# Patient Record
Sex: Male | Born: 1995 | Race: White | Hispanic: No | Marital: Single | Smoking: Never smoker
Health system: Southern US, Community
[De-identification: ages and names within clinical notes are randomized; demographics above are authoritative.]

## PROBLEM LIST (undated history)

## (undated) DIAGNOSIS — L709 Acne, unspecified: Secondary | ICD-10-CM

## (undated) HISTORY — DX: Acne, unspecified: L70.9

---
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
## 2016-05-16 ENCOUNTER — Ambulatory Visit: Payer: 59 | Admitting: Podiatry

## 2016-05-19 ENCOUNTER — Ambulatory Visit (INDEPENDENT_AMBULATORY_CARE_PROVIDER_SITE_OTHER): Payer: 59

## 2016-05-19 ENCOUNTER — Ambulatory Visit (INDEPENDENT_AMBULATORY_CARE_PROVIDER_SITE_OTHER): Payer: 59 | Admitting: Podiatry

## 2016-05-19 ENCOUNTER — Encounter: Payer: Self-pay | Admitting: Podiatry

## 2016-05-19 VITALS — BP 122/71 | HR 108 | Resp 18

## 2016-05-19 DIAGNOSIS — M2141 Flat foot [pes planus] (acquired), right foot: Secondary | ICD-10-CM

## 2016-05-19 DIAGNOSIS — M779 Enthesopathy, unspecified: Secondary | ICD-10-CM

## 2016-05-19 DIAGNOSIS — M2142 Flat foot [pes planus] (acquired), left foot: Secondary | ICD-10-CM | POA: Diagnosis not present

## 2016-05-19 DIAGNOSIS — Q6689 Other  specified congenital deformities of feet: Secondary | ICD-10-CM

## 2016-05-19 DIAGNOSIS — M79672 Pain in left foot: Secondary | ICD-10-CM

## 2016-05-19 NOTE — Progress Notes (Signed)
   Subjective:    Patient ID: Shawn Osborne, male    DOB: January 01, 1996, 20 y.o.   MRN: [id number]  HPI  20 year old male presents the office if his mom for concerns of left foot pain which is been ongoing for approximately 2 months. He gets majority of pain to the outside aspect of his left foot mostly to a lot of walking or standing or after working all week. He did go see Dr. Eulah Pont had a steroid injection performed which should help for about 1 week. He said no other treatment. Denies any recent injury or trauma. No swelling or redness at this time. The pain does not wake him up at night. No other complaints.  Review of Systems  All other systems reviewed and are negative.      Objective:   Physical Exam General: AAO x3, NAD  Dermatological: Skin is warm, dry and supple bilateral. Nails x 10 are well manicured; remaining integument appears unremarkable at this time. There are no open sores, no preulcerative lesions, no rash or signs of infection present.  Vascular: Dorsalis Pedis artery and Posterior Tibial artery pedal pulses are 2/4 bilateral with immedate capillary fill time. Pedal hair growth present. There is no pain with calf compression, swelling, warmth, erythema.   Neruologic: Grossly intact via light touch bilateral. Vibratory intact via tuning fork bilateral. Protective threshold with Semmes Wienstein monofilament intact to all pedal sites bilateral.   Musculoskeletal: There is a decrease in medial arch upon weightbearing bilaterally. Her left foot there does appear to be decreased range of motion of the subtalar joint and the foot is sitting and somewhat everted position and a peroneal spasm type position. There is Taylor's on the lateral aspect of the sinus tarsi on the left foot. Unable to re-create the arch and the left side with dorsiflexion of the hallux. There is no other areas of tenderness bilaterally. Mild equinus is present. MMT 5/5.  Gait: Unassisted, Nonantalgic.       Assessment & Plan:  20 year old male left foot pain, flatfoot, rule out tarsal coalition -Treatment options discussed including all alternatives, risks, and complications -Etiology of symptoms were discussed -X-rays were obtained and reviewed with the patient. Flatfoot deformity is present. No evidence of acute fracture or stress fracture. Unable to visualize osseous coalition. -At this time given his symptoms as well as pain and recommended MRI to rule out tarsal coalition. This was ordered today. -Discussed with him surgical intervention versus orthotics. We'll await results the MRI before proceeding with further treatment. -Follow-up after MRI or sooner if any problems arise. In the meantime, encouraged to call the office with any questions, concerns, change in symptoms.   Ovid Curd, DPM

## 2016-05-20 ENCOUNTER — Telehealth: Payer: Self-pay | Admitting: *Deleted

## 2016-05-20 NOTE — Telephone Encounter (Addendum)
Dr. Ardelle Anton ordere MRI left foot.  [employer] requirres PEER TO PEER [phone] for HXTA#[id number]. AUTHORIZATION [phone], VALID Tanna Savoy 07/06/2016. FAXED TO [facility]. 05/28/2016-Left message informing pt's mtr that Dr. Ardelle Anton was sending MRI copy disc to [facility] overread service for more indepth reading and there would be a delay of the MRI results. Mailed to [employer]. Pt's mtr, Silva Bandy called with questions about the overread.  I left message informing pt's mtr that the overread was performed by a specializing radiologist, and Dr. Ardelle Anton may have wanted to see the areas affected more clearly or the severity of the structure formation. 06/02/2016-Left message requesting call back for overread results.

## 2016-05-22 NOTE — Telephone Encounter (Signed)
Authorization [id number] until 07/06/2016

## 2016-05-27 ENCOUNTER — Ambulatory Visit
Admission: RE | Admit: 2016-05-27 | Discharge: 2016-05-27 | Disposition: A | Discharge status: Home / Self Care | Payer: 59 | Source: Ambulatory Visit | Attending: Podiatry | Admitting: Podiatry

## 2016-05-27 ENCOUNTER — Encounter: Payer: Self-pay | Admitting: Podiatry

## 2016-05-27 DIAGNOSIS — M79672 Pain in left foot: Secondary | ICD-10-CM

## 2016-05-27 DIAGNOSIS — Q6689 Other  specified congenital deformities of feet: Secondary | ICD-10-CM

## 2016-05-28 NOTE — Telephone Encounter (Signed)
-----   Message from Vivi BarrackMatthew R Wagoner, DPM sent at 05/28/2016 11:52 AM EDT ----- Can you send the MRI for a second opinion and please let the patient know. Thanks.

## 2016-06-04 ENCOUNTER — Ambulatory Visit (INDEPENDENT_AMBULATORY_CARE_PROVIDER_SITE_OTHER): Payer: 59 | Admitting: Podiatry

## 2016-06-04 ENCOUNTER — Encounter: Payer: Self-pay | Admitting: Podiatry

## 2016-06-04 DIAGNOSIS — M2141 Flat foot [pes planus] (acquired), right foot: Secondary | ICD-10-CM

## 2016-06-04 DIAGNOSIS — Q6689 Other  specified congenital deformities of feet: Secondary | ICD-10-CM | POA: Diagnosis not present

## 2016-06-04 DIAGNOSIS — M2142 Flat foot [pes planus] (acquired), left foot: Secondary | ICD-10-CM

## 2016-06-06 DIAGNOSIS — Q6689 Other  specified congenital deformities of feet: Secondary | ICD-10-CM | POA: Insufficient documentation

## 2016-06-06 NOTE — Progress Notes (Signed)
Subjective: 20 year old male presents the office today for follow-up evaluation of left foot pain and to discuss MRI results. Since last appointment he is no significant change. Scheduled at the college on Friday. Denies any systemic complaints such as fevers, chills, nausea, vomiting. No acute changes since last appointment, and no other complaints at this time.   Objective: AAO x3, NAD DP/PT pulses palpable bilaterally, CRT less than 3 seconds There is decreased range of motion the subtotal the left side there is tennis on lateral aspect of the foot on the sinus tarsi. There is a decrease in medial arch height which appears to be rigid. Equinus is present. There is no other areas of tenderness identified at this time. No open lesions or pre-ulcerative lesions No pain with calf compression, swelling, warmth, erythema.   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 I discussed with him treatment options including both surgical and nonsurgical treatment. He is scheduled to college on Friday. He wishes to hold off on surgery at this point if possible. I discussed with them the etiology of this coalition as well as the progressive nature. At this time a discussed with him custom orthotics with a deep heel cup in the meantime. He was scanned for [facility] today [facility]  -Patient encouraged to call the office with any questions, concerns, change in symptoms.   Ovid CurdMatthew Wagoner, DPM

## 2017-03-05 ENCOUNTER — Ambulatory Visit (INDEPENDENT_AMBULATORY_CARE_PROVIDER_SITE_OTHER): Payer: 59 | Admitting: Podiatry

## 2017-03-05 DIAGNOSIS — Q6689 Other  specified congenital deformities of feet: Secondary | ICD-10-CM | POA: Diagnosis not present

## 2017-03-05 DIAGNOSIS — M79672 Pain in left foot: Secondary | ICD-10-CM

## 2017-03-05 NOTE — Patient Instructions (Signed)

## 2017-03-09 NOTE — Progress Notes (Signed)
Subjective: 21 year old male presents the office today for follow-up evaluation of left foot pain. He states he has been wearing the orthotics which does help some he continues to get quite a bit of pain in the outside aspect of the left foot is becoming an ongoing issue. He states it is home for the summer and he will do him proceed with surgical intervention as the pain is not significantly improved with the orthotics. He states this hurts on a daily basis and also tried changing shoes that he significant improvement. Denies any systemic complaints such as fevers, chills, nausea, vomiting. No acute changes since last appointment, and no other complaints at this time.   Objective: AAO x3, NAD DP/PT pulses palpable bilaterally, CRT less than 3 seconds There is decreased range of motion the subtotal the left side there is tenderness on lateral aspect of the foot on the sinus tarsi. There is a decrease in medial arch height which appears to be rigid. Equinus is present. There is no other areas of tenderness identified at this time. No open lesions or pre-ulcerative lesions No pain with calf compression, swelling, warmth, erythema.   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 At this point he wishes to proceed with surgical intervention. I discussed with him coalition resection and he wishes to proceed with this. He presents with his mother. I discussed with him the surgery as well as the postoperative course including risks and complications. Discussed with remaining to have a subtalar joint fusion in the future or he could have worsening pain after the surgery. -The incision placement as well as the postoperative course was discussed with the patient. I discussed risks of the surgery which include,  but not limited to, infection, bleeding, pain, swelling, need for further surgery, delayed or nonhealing, painful or ugly scar, numbness or sensation changes, over/under correction, recurrence, transfer lesions, further deformity, hardware failure, DVT/PE, loss of toe/foot. Patient understands these risks and wishes to proceed with surgery. The surgical consent was reviewed with the patient all 3 pages were signed. No promises or guarantees were given to the outcome of the procedure. All questions were answered to the best of my ability. Before the surgery the patient was encouraged to call the office if there is any further questions. [facility]. -CAM boot dispensed for postop and directed on use.   Ovid CurdMatthew Lacey Dotson, DPM

## 2017-03-23 ENCOUNTER — Telehealth: Payer: Self-pay | Admitting: *Deleted

## 2017-03-23 NOTE — Telephone Encounter (Signed)
"  My son, Fayrene FearingJames, is having surgery with Dr. Ardelle AntonWagoner on Wednesday.  I want to find out if the insurance company has approved.  I have received no information from you guys on what our cost will be or If it's even been approved.  Please give me a call.  Chip BoerVicki returned her call and gave her an estimate.  She also informed her that the surgery had been approved by [facility].

## 2017-03-25 ENCOUNTER — Encounter: Payer: Self-pay | Admitting: Podiatry

## 2017-03-25 DIAGNOSIS — Q6689 Other  specified congenital deformities of feet: Secondary | ICD-10-CM | POA: Diagnosis not present

## 2017-03-30 ENCOUNTER — Other Ambulatory Visit: Payer: Self-pay | Admitting: Podiatry

## 2017-03-30 ENCOUNTER — Ambulatory Visit (INDEPENDENT_AMBULATORY_CARE_PROVIDER_SITE_OTHER): Payer: 59

## 2017-03-30 ENCOUNTER — Ambulatory Visit (INDEPENDENT_AMBULATORY_CARE_PROVIDER_SITE_OTHER): Payer: Self-pay | Admitting: Podiatry

## 2017-03-30 ENCOUNTER — Encounter: Payer: Self-pay | Admitting: Podiatry

## 2017-03-30 DIAGNOSIS — Q6689 Other  specified congenital deformities of feet: Secondary | ICD-10-CM

## 2017-03-30 NOTE — Progress Notes (Signed)
Subjective: Shawn Osborne is a 21 y.o. is seen today in office s/p left subtalar joint coalition resection preformed on 03/25/2017. They state their pain is improved. He states pain medication mostly at night or if he goes out to dinner or something and his feet are down and they well. He has been NWB and wearing the surgical boot. Denies any systemic complaints such as fevers, chills, nausea, vomiting. No calf pain, chest pain, shortness of breath.   Objective: General: No acute distress, AAOx3  DP/PT pulses palpable 2/4, CRT < 3 sec to all digits.  Protective sensation intact. Motor function intact.  Left foot: Incision is well coapted without any evidence of dehiscence and sutures and staples are intact. There is no surrounding erythema, ascending cellulitis, fluctuance, crepitus, malodor, drainage/purulence. There is mild edema around the surgical site. There is mild pain along the surgical site. There is some pain with STJ ROM, at the end of ROM but the motion is improved compared to preoperatively. The patients mother also noted increased motion.  No other areas of tenderness to bilateral lower extremities.  No other open lesions or pre-ulcerative lesions.  No pain with calf compression, swelling, warmth, erythema.   Assessment and Plan:  Status post right STJ coalition resection, doing well with no complications   -Treatment options discussed including all alternatives, risks, and complications -X-rays were obtained and reviewed with the patient. No evidence of acute fracture.  -Antibiotic ointment and a bandage applied. Keep clean, dry, intact.  -Ice/elevation -NWB- can start to WB in the CAM boot this weekend if able. Also gentle ROM exercises.  -Pain medication as needed. -Monitor for any clinical signs or symptoms of infection and DVT/PE and directed to call the office immediately should any occur or go to the ER. -Follow-up in 1 week for suture rmeoval or sooner if any problems  arise. In the meantime, encouraged to call the office with any questions, concerns, change in symptoms.   Ovid CurdMatthew Santonio Speakman, DPM  Ovid CurdMatthew Berta Denson, DPM

## 2017-04-06 ENCOUNTER — Ambulatory Visit (INDEPENDENT_AMBULATORY_CARE_PROVIDER_SITE_OTHER): Payer: Self-pay | Admitting: Podiatry

## 2017-04-06 ENCOUNTER — Encounter: Payer: Self-pay | Admitting: Podiatry

## 2017-04-06 DIAGNOSIS — Q6689 Other  specified congenital deformities of feet: Secondary | ICD-10-CM

## 2017-04-06 NOTE — Progress Notes (Signed)
Subjective: Shawn Osborne is a 21 y.o. is seen today in office s/p left subtalar joint coalition resection preformed on 03/25/2017. He states he is doing well and is built to walk in the cam boot without any pain. He has been taking IV present for payment not taking any narcotic pain medicine. He has done some gradual range of motion exercises but not much.  Denies any systemic complaints such as fevers, chills, nausea, vomiting. No calf pain, chest pain, shortness of breath.   Objective: General: No acute distress, AAOx3  DP/PT pulses palpable 2/4, CRT < 3 sec to all digits.  Protective sensation intact. Motor function intact.  Left foot: Incision is well coapted without any evidence of dehiscence and sutures and staples are intact. There is no surrounding erythema, ascending cellulitis, fluctuance, crepitus, malodor, drainage/purulence. There is mild edema around the surgical site, however improved. There is minimal pain along the surgical site. Subtalar joint range of motion is improved compared to preoperatively however still restricted. There is no pain with range of motion today. No other areas of tenderness to bilateral lower extremities.  No other open lesions or pre-ulcerative lesions.  No pain with calf compression, swelling, warmth, erythema.   Assessment and Plan:  Status post right STJ coalition resection, doing well with no complications   -Treatment options discussed including all alternatives, risks, and complications -At this time apply for him to continue with the  CAM boot beginning weightbearing as tolerated. Discussed the range of motion exercises. Because a solid the stitches the staples intact until next appointment. Continue ice and elevation and anti-inflammatories as needed. -Monitor for any clinical signs or symptoms of infection and DVT/PE and directed to call the office immediately should any occur or go to the ER. -Follow-up in 1 week for suture removal or sooner if  any problems arise. In the meantime, encouraged to call the office with any questions, concerns, change in symptoms.   Ovid CurdMatthew Wagoner, DPM

## 2017-04-10 NOTE — Progress Notes (Signed)
DOS 06.06.2018 Left foot subtalar join coalition removal.

## 2017-04-13 ENCOUNTER — Ambulatory Visit (INDEPENDENT_AMBULATORY_CARE_PROVIDER_SITE_OTHER): Payer: 59 | Admitting: Podiatry

## 2017-04-13 DIAGNOSIS — Q6689 Other  specified congenital deformities of feet: Secondary | ICD-10-CM

## 2017-04-15 NOTE — Progress Notes (Signed)
Subjective: Shawn SimsJames M Osborne is a 21 y.o. is seen today in office s/p left subtalar joint coalition resection preformed on 03/25/2017. He states he is doing well and is built to walk in the cam boot without any pain. He has been taking IV present for payment not taking any narcotic pain medicine. He has done some gradual range of motion exercises but not much.  Denies any systemic complaints such as fevers, chills, nausea, vomiting. No calf pain, chest pain, shortness of breath.   Objective: General: No acute distress, AAOx3  DP/PT pulses palpable 2/4, CRT < 3 sec to all digits.  Protective sensation intact. Motor function intact.  Left foot: Incision is well coapted without any evidence of dehiscence and sutures and staples are intact. There is no surrounding erythema, ascending cellulitis, fluctuance, crepitus, malodor, drainage/purulence. There is minimal edema around the surgical site. There is no pain along the surgical site. Subtalar joint range of motion is improved compared to preoperatively however still restricted. There is only pain at end range of motion of the joint. No other areas of tenderness identified at this time.  There is no pain with range of motion today. No other areas of tenderness to bilateral lower extremities.  No other open lesions or pre-ulcerative lesions.  No pain with calf compression, swelling, warmth, erythema.   Assessment and Plan:  Status post right STJ coalition resection, doing well with no complications   -Treatment options discussed including all alternatives, risks, and complications -Sutures removed the staples remained intact. Antibiotic ointment and a bandage was applied. -Continue with range of motion exercises. Continue walking in CAM boot for the next 2 weeks. At that point we'll start physical therapy and likely start to transition to a shoe. Continue ice and elevation. He is not requiring pain medication this point.I'll see him back in 2 weeks or  sooner if needed. I encouraged to call the questions or concerns or any change in symptoms. -Monitor for any clinical signs or symptoms of infection and DVT/PE and directed to call the office immediately should any occur or go to the ER.  Shawn CurdMatthew Osborne, DPM

## 2017-05-04 ENCOUNTER — Ambulatory Visit (INDEPENDENT_AMBULATORY_CARE_PROVIDER_SITE_OTHER): Payer: Self-pay | Admitting: Podiatry

## 2017-05-04 DIAGNOSIS — Q6689 Other  specified congenital deformities of feet: Secondary | ICD-10-CM

## 2017-05-05 NOTE — Progress Notes (Signed)
Subjective: Shawn Osborne is a 21 y.o. is seen today in office s/p left subtalar joint coalition resection preformed on 03/25/2017. Since last appointment he states that he is doing well and he is returned to a regular shoe for which she presents in today. He is wearing a flat shoe without his orthotic today. He states the pain is improved compared to what it was prior to surgery. His only issue today staples or pulling and causing itching. Denies any drainage or redness or any swelling along the surgical site. Overall he states he is doing well. Denies any systemic complaints such as fevers, chills, nausea, vomiting. No calf pain, chest pain, shortness of breath.   Objective: General: No acute distress, AAOx3  DP/PT pulses palpable 2/4, CRT < 3 sec to all digits.  Protective sensation intact. Motor function intact.  Left foot: Incision is well coapted without any evidence of dehiscence and  staples are intact. There is faint redness from around the staple sites however there is no other areas of redness and there is no ascending cellulitis. There is no drainage or also identified. There is no clinical signs of infection. Minimal tenderness palpation of the surgical sites however this appears to be mostly on the staples. There is no pain on the sinus tarsi today. There is actually somewhat improved range of motion of the subtalar joint compared to what it was even when I last saw him as his swelling has improved. There is no area pinpoint tenderness. No other open lesions or pre-ulcerative lesions.  No pain with calf compression, swelling, warmth, erythema.   Assessment and Plan:  Status post right STJ coalition resection, doing well with no complications   -Treatment options discussed including all alternatives, risks, and complications -Staples were removed today without any complications and afterwards the incision remained coapted. Antibiotic ointment and a bandage was applied. Continue daily  dressing changes. -Continue cocoa butter/vitamin E cream to help with scarring. -Continue range of motion exercises. We'll also start physical therapy and a prescription was given to him today for [facility] physical therapy. -Continue orthotics as well as supportive shoes. -Gradually increase activity level. -Follow-up in 3 weeks or sooner if needed. Call any questions or concerns in the meantime.  Ovid CurdMatthew Wagoner, DPM

## 2017-05-25 ENCOUNTER — Ambulatory Visit: Payer: 59 | Admitting: Podiatry

## 2017-06-04 ENCOUNTER — Ambulatory Visit (INDEPENDENT_AMBULATORY_CARE_PROVIDER_SITE_OTHER): Payer: 59 | Admitting: Podiatry

## 2017-06-04 ENCOUNTER — Ambulatory Visit (INDEPENDENT_AMBULATORY_CARE_PROVIDER_SITE_OTHER): Payer: 59

## 2017-06-04 ENCOUNTER — Encounter: Payer: Self-pay | Admitting: Podiatry

## 2017-06-04 DIAGNOSIS — Q6689 Other  specified congenital deformities of feet: Secondary | ICD-10-CM

## 2017-06-05 NOTE — Progress Notes (Signed)
Subjective: Shawn Osborne is a 21 y.o. is seen today in office s/p left subtalar joint coalition resection preformed on 03/25/2017. He states he is doing very well and he presents today wearing a flip-flop. He states he did not go to physical therapy. She's had no pain has been able to do several activities without any problems. He just did a 2.6 mile hike at [employer] and he had no pain. States his symptoms are much improved compared to what it was prior to surgery and he is happy with the result. He has no concerns. Denies any systemic complaints such as fevers, chills, nausea, vomiting. No calf pain, chest pain, shortness of breath.   Objective: General: No acute distress, AAOx3  DP/PT pulses palpable 2/4, CRT < 3 sec to all digits.  Protective sensation intact. Motor function intact.  Left foot: Incision is well coapted without any evidence of dehiscence and  a scar is well formed. There is no pain the surgical site. There is no swelling erythema, ascending cellulitis, drainage that is no clinical signs of infection the area is well-healed. There is no pain with subtalar joint range of motion is no tenderness on the surgical site. Range of motion appears to be much improved compared to what it was prior to surgery. No other areas of tenderness identified at this time. No other open lesions or pre-ulcerative lesions.  No pain with calf compression, swelling, warmth, erythema.   Assessment and Plan:  Status post right STJ coalition resection, doing well with no complications   -Treatment options discussed including all alternatives, risks, and complications -X-rays were taken and reviewed. There is no evidence of acute fracture identified. -At this point discussed and continue supportive shoe gear and orthotics. Discussed them sandals with good arch support if he wears them. At this point he is doing well and is having no pain is able to do activities without any problems. Discharge up in the  postoperative course. If there are any issues to call the office.  Ovid Curd, DPM

## 2018-05-14 ENCOUNTER — Ambulatory Visit: Payer: BLUE CROSS/BLUE SHIELD | Admitting: Podiatry

## 2018-05-14 ENCOUNTER — Encounter: Payer: Self-pay | Admitting: Podiatry

## 2018-05-14 ENCOUNTER — Other Ambulatory Visit: Payer: Self-pay | Admitting: Podiatry

## 2018-05-14 ENCOUNTER — Ambulatory Visit (INDEPENDENT_AMBULATORY_CARE_PROVIDER_SITE_OTHER): Payer: BLUE CROSS/BLUE SHIELD

## 2018-05-14 DIAGNOSIS — M2142 Flat foot [pes planus] (acquired), left foot: Secondary | ICD-10-CM

## 2018-05-14 DIAGNOSIS — M779 Enthesopathy, unspecified: Secondary | ICD-10-CM

## 2018-05-14 DIAGNOSIS — M2141 Flat foot [pes planus] (acquired), right foot: Secondary | ICD-10-CM

## 2018-05-14 MED ORDER — MELOXICAM 15 MG PO TABS: 15.0000 mg | ORAL_TABLET | Freq: Every day | ORAL | 0 refills | Status: DC

## 2018-05-14 NOTE — Patient Instructions (Signed)
Ankle Sprain, Phase I Rehab Ask your health care provider which exercises are safe for you. Do exercises exactly as told by your health care provider and adjust them as directed. It is normal to feel mild stretching, pulling, tightness, or discomfort as you do these exercises, but you should stop right away if you feel sudden pain or your pain gets worse.Do not begin these exercises until told by your health care provider. Stretching and range of motion exercises These exercises warm up your muscles and joints and improve the movement and flexibility of your lower leg and ankle. These exercises also help to relieve pain and stiffness. Exercise A: Gastroc and soleus stretch  1. Sit on the floor with your left / right leg extended. 2. Loop a belt or towel around the ball of your left / right foot. The ball of your foot is on the walking surface, right under your toes. 3. Keep your left / right ankle and foot relaxed and keep your knee straight while you use the belt or towel to pull your foot toward you. You should feel a gentle stretch behind your calf or knee. 4. Hold this position for __________ seconds, then release to the starting position. Repeat the exercise with your knee bent. You can put a pillow or a rolled bath towel under your knee to support it. You should feel a stretch deep in your calf or at your Achilles tendon. Repeat each stretch __________ times. Complete these stretches __________ times a day. Exercise B: Ankle alphabet  1. Sit with your left / right leg supported at the lower leg. ? Do not rest your foot on anything. ? Make sure your foot has room to move freely. 2. Think of your left / right foot as a paintbrush, and move your foot to trace each letter of the alphabet in the air. Keep your hip and knee still while you trace. Make the letters as large as you can without feeling discomfort. 3. Trace every letter from A to Z. Repeat __________ times. Complete this exercise  __________ times a day. Strengthening exercises These exercises build strength and endurance in your ankle and lower leg. Endurance is the ability to use your muscles for a long time, even after they get tired. Exercise C: Dorsiflexors  1. Secure a rubber exercise band or tube to an object, such as a table leg, that will stay still when the band is pulled. Secure the other end around your left / right foot. 2. Sit on the floor facing the object, with your left / right leg extended. The band or tube should be slightly tense when your foot is relaxed. 3. Slowly bring your foot toward you, pulling the band tighter. 4. Hold this position for __________ seconds. 5. Slowly return your foot to the starting position. Repeat __________ times. Complete this exercise __________ times a day. Exercise D: Plantar flexors  1. Sit on the floor with your left / right leg extended. 2. Loop a rubber exercise tube or band around the ball of your left / right foot. The ball of your foot is on the walking surface, right under your toes. ? Hold the ends of the band or tube in your hands. ? The band or tube should be slightly tense when your foot is relaxed. 3. Slowly point your foot and toes downward, pushing them away from you. 4. Hold this position for __________ seconds. 5. Slowly return your foot to the starting position. Repeat __________ times. Complete   this exercise __________ times a day. Exercise E: Evertors 1. Sit on the floor with your legs straight out in front of you. 2. Loop a rubber exercise band or tube around the ball of your left / right foot. The ball of your foot is on the walking surface, right under your toes. ? Hold the ends of the band in your hands, or secure the band to a stable object. ? The band or tube should be slightly tense when your foot is relaxed. 3. Slowly push your foot outward, away from your other leg. 4. Hold this position for __________ seconds. 5. Slowly return your  foot to the starting position. Repeat __________ times. Complete this exercise __________ times a day. This information is not intended to replace advice given to you by your health care provider. Make sure you discuss any questions you have with your health care provider. Document Released: 05/07/2005 Document Revised: 06/12/2016 Document Reviewed: 08/20/2015 Elsevier Interactive Patient Education  2018 [employer] Inc[employer]  

## 2018-05-17 NOTE — Progress Notes (Signed)
Subjective: 22 year old male presents the office today with concerns of left foot pain.  He states he was doing well from the surgery until about 7 weeks ago he started to have some pain to the ankle.  He states that he gets the majority of pain after he is at work on his feet all day.  He does wear orthotics inside of his work boots.  No recent injury or swelling.  No recent treatment otherwise. Denies any systemic complaints such as fevers, chills, nausea, vomiting. No acute changes since last appointment, and no other complaints at this time.   Objective: AAO x3, NAD DP/PT pulses palpable bilaterally, CRT less than 3 seconds There is mild tenderness palpation along the sinus tarsi on the left foot.  There is decreased range of motion of subtalar joint identified today.  There is also some mild discomfort on the course of the posterior tibial tendon subjectively, there is no significant discomfort today.  Decreased medial arch height.  There is no pain or crepitation with ankle joint range of motion.  No area pinpoint tenderness. No open lesions or pre-ulcerative lesions.  No pain with calf compression, swelling, warmth, erythema  Assessment: Capsulitis, flatfoot deformity  Plan: -All treatment options discussed with the patient including all alternatives, risks, complications.  -X-rays were obtained and reviewed.  No evidence of acute fracture identified. -This time I want him to get back into the exercises, rehab that he was doing after the surgery.  Also continue with orthotics at all times just not when he is working.  Discussed ankle brace if needed for short-term.  Symptoms continue her ankle but with physical therapy. Prescribed mobic. Discussed side effects of the medication and directed to stop if any are to occur and call the office.  -Patient encouraged to call the office with any questions, concerns, change in symptoms.   Vivi BarrackMatthew R Zerick Osborne DPM

## 2018-05-31 ENCOUNTER — Ambulatory Visit (INDEPENDENT_AMBULATORY_CARE_PROVIDER_SITE_OTHER): Payer: BLUE CROSS/BLUE SHIELD | Admitting: Orthotics

## 2018-05-31 DIAGNOSIS — M779 Enthesopathy, unspecified: Secondary | ICD-10-CM | POA: Diagnosis not present

## 2018-05-31 DIAGNOSIS — M2142 Flat foot [pes planus] (acquired), left foot: Secondary | ICD-10-CM

## 2018-05-31 DIAGNOSIS — M2141 Flat foot [pes planus] (acquired), right foot: Secondary | ICD-10-CM

## 2018-05-31 DIAGNOSIS — Q6689 Other  specified congenital deformities of feet: Secondary | ICD-10-CM

## 2018-05-31 NOTE — Progress Notes (Signed)
Patient came into today to be cast for Custom Foot Orthotics. Upon recommendation of Dr. Ardelle AntonWagoner Patient presents with pes planus, capsuitis,  Goals are long arch support, forefoot cushioning. Plan vendor [employer]

## 2018-06-11 ENCOUNTER — Telehealth: Payer: Self-pay | Admitting: *Deleted

## 2018-06-11 MED ORDER — MELOXICAM 15 MG PO TABS: 15.0000 mg | ORAL_TABLET | Freq: Every day | ORAL | 0 refills | Status: DC

## 2018-06-11 NOTE — Telephone Encounter (Signed)
Refill request for Meloxicam. Dr. Ardelle AntonWagoner states refill once and if pt continues to have pain, needs an appt prior to future refills.

## 2018-10-04 DIAGNOSIS — Z5181 Encounter for therapeutic drug level monitoring: Secondary | ICD-10-CM | POA: Diagnosis not present

## 2018-10-04 DIAGNOSIS — L700 Acne vulgaris: Secondary | ICD-10-CM | POA: Diagnosis not present

## 2018-11-19 DIAGNOSIS — L709 Acne, unspecified: Secondary | ICD-10-CM | POA: Diagnosis not present

## 2018-11-19 DIAGNOSIS — K130 Diseases of lips: Secondary | ICD-10-CM | POA: Diagnosis not present

## 2018-11-19 DIAGNOSIS — L089 Local infection of the skin and subcutaneous tissue, unspecified: Secondary | ICD-10-CM | POA: Diagnosis not present

## 2018-12-20 DIAGNOSIS — L709 Acne, unspecified: Secondary | ICD-10-CM | POA: Diagnosis not present

## 2018-12-20 DIAGNOSIS — Z5181 Encounter for therapeutic drug level monitoring: Secondary | ICD-10-CM | POA: Diagnosis not present

## 2018-12-20 DIAGNOSIS — K130 Diseases of lips: Secondary | ICD-10-CM | POA: Diagnosis not present

## 2018-12-29 ENCOUNTER — Ambulatory Visit: Payer: BLUE CROSS/BLUE SHIELD | Admitting: Adult Health

## 2018-12-29 ENCOUNTER — Ambulatory Visit (INDEPENDENT_AMBULATORY_CARE_PROVIDER_SITE_OTHER): Payer: BLUE CROSS/BLUE SHIELD

## 2018-12-29 ENCOUNTER — Encounter: Payer: Self-pay | Admitting: Adult Health

## 2018-12-29 ENCOUNTER — Other Ambulatory Visit: Payer: Self-pay

## 2018-12-29 VITALS — BP 118/82 | Temp 98.0°F | Wt 234.0 lb

## 2018-12-29 DIAGNOSIS — M545 Low back pain, unspecified: Secondary | ICD-10-CM

## 2018-12-29 DIAGNOSIS — S4991XA Unspecified injury of right shoulder and upper arm, initial encounter: Secondary | ICD-10-CM | POA: Diagnosis not present

## 2018-12-29 DIAGNOSIS — Z23 Encounter for immunization: Secondary | ICD-10-CM | POA: Diagnosis not present

## 2018-12-29 DIAGNOSIS — M25511 Pain in right shoulder: Secondary | ICD-10-CM | POA: Diagnosis not present

## 2018-12-29 DIAGNOSIS — S3992XA Unspecified injury of lower back, initial encounter: Secondary | ICD-10-CM | POA: Diagnosis not present

## 2018-12-29 MED ORDER — METHYLPREDNISOLONE 4 MG PO TBPK: ORAL_TABLET | ORAL | 0 refills | Status: DC

## 2018-12-29 NOTE — Addendum Note (Signed)
Addended by: Raj Janus T on: 12/29/2018 09:39 AM   Modules accepted: Orders
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
## 2018-12-30 DIAGNOSIS — M25511 Pain in right shoulder: Secondary | ICD-10-CM | POA: Diagnosis not present

## 2019-01-21 DIAGNOSIS — K130 Diseases of lips: Secondary | ICD-10-CM | POA: Diagnosis not present

## 2019-01-21 DIAGNOSIS — L709 Acne, unspecified: Secondary | ICD-10-CM | POA: Diagnosis not present

## 2019-03-07 DIAGNOSIS — L709 Acne, unspecified: Secondary | ICD-10-CM | POA: Diagnosis not present

## 2019-03-08 ENCOUNTER — Other Ambulatory Visit: Payer: Self-pay

## 2019-03-08 ENCOUNTER — Ambulatory Visit: Payer: BLUE CROSS/BLUE SHIELD | Admitting: Adult Health

## 2019-03-08 NOTE — Progress Notes (Deleted)
Virtual Visit via Video Note  I connected withJames BROWARD Osborne on 03/08/19 at 10:30 AM EDT by a video enabled telemedicine application and verified that I am speaking with the correct person using two identifiers.  Location patient: home Location provider:work or home office Persons participating in the virtual visit: patient, provider  I discussed the limitations of evaluation and management by telemedicine and the availability of in person appointments. The patient expressed understanding and agreed to proceed.   Patient presents to clinic today to establish care.  Acute Concerns: Establish Care   Chronic Issues:   Health Maintenance: Dental -- Vision -- Immunizations -- Colonoscopy --    No past medical history on file.  No past surgical history on file.  Current Outpatient Medications on File Prior to Visit  Medication Sig Dispense Refill  . CLARAVIS 40 MG capsule Take 40 mg of amoxicillin by mouth daily.    . methylPREDNISolone (MEDROL DOSEPAK) 4 MG TBPK tablet Take as directed 21 tablet 0   No current facility-administered medications on file prior to visit.     No Known Allergies  No family history on file.  Social History   Socioeconomic History  . Marital status: Single    Spouse name: Not on file  . Number of children: Not on file  . Years of education: Not on file  . Highest education level: Not on file  Occupational History  . Not on file  Social Needs  . Financial resource strain: Not on file  . Food insecurity:    Worry: Not on file    Inability: Not on file  . Transportation needs:    Medical: Not on file    Non-medical: Not on file  Tobacco Use  . Smoking status: Never Smoker  . Smokeless tobacco: Never Used  Substance and Sexual Activity  . Alcohol use: Not on file  . Drug use: Not on file  . Sexual activity: Not on file  Lifestyle  . Physical activity:    Days per week: Not on file    Minutes per session: Not on file  .  Stress: Not on file  Relationships  . Social connections:    Talks on phone: Not on file    Gets together: Not on file    Attends religious service: Not on file    Active member of club or organization: Not on file    Attends meetings of clubs or organizations: Not on file    Relationship status: Not on file  . Intimate partner violence:    Fear of current or ex partner: Not on file    Emotionally abused: Not on file    Physically abused: Not on file    Forced sexual activity: Not on file  Other Topics Concern  . Not on file  Social History Narrative  . Not on file    ROS  There were no vitals taken for this visit.  Physical Exam VITALS per patient if applicable:  GENERAL: alert, oriented, appears well and in no acute distress  HEENT: atraumatic, conjunttiva clear, no obvious abnormalities on inspection of external nose and ears  NECK: normal movements of the head and neck  LUNGS: on inspection no signs of respiratory distress, breathing rate appears normal, no obvious gross SOB, gasping or wheezing  CV: no obvious cyanosis  MS: moves all visible extremities without noticeable abnormality  PSYCH/NEURO: pleasant and cooperative, no obvious depression or anxiety, speech and thought processing grossly intact  No  results found for this or any previous visit (from the past 2160 hour(s)).  Assessment/Plan:  Discussed the following assessment and plan:  No diagnosis found.     I discussed the assessment and treatment plan with the patient. The patient was provided an opportunity to ask questions and all were answered. The patient agreed with the plan and demonstrated an understanding of the instructions.   The patient was advised to call back or seek an in-person evaluation if the symptoms worsen or if the condition fails to improve as anticipated.   Shirline Freesory Megin Consalvo, NP

## 2019-04-11 DIAGNOSIS — K130 Diseases of lips: Secondary | ICD-10-CM | POA: Diagnosis not present

## 2019-04-11 DIAGNOSIS — L709 Acne, unspecified: Secondary | ICD-10-CM | POA: Diagnosis not present

## 2019-05-10 ENCOUNTER — Ambulatory Visit (INDEPENDENT_AMBULATORY_CARE_PROVIDER_SITE_OTHER): Payer: BC Managed Care – PPO | Admitting: Mental Health

## 2019-05-10 ENCOUNTER — Other Ambulatory Visit: Payer: Self-pay

## 2019-05-10 DIAGNOSIS — F331 Major depressive disorder, recurrent, moderate: Secondary | ICD-10-CM

## 2019-05-10 NOTE — Progress Notes (Signed)
[facility] Counselor Initial Adult Exam  Name: Shawn Osborne Date: 05/10/2019 MRN: [id number] DOB: 01-13-1996 PCP: Dorothyann Peng, NP  Time spent: 55 minutes  Guardian/Payee:  self  Paperwork requested:  self  Reason for Visit /Presenting Problem: pt reports feeling tired daily, sluggish for the past 2 years. Reports hx of AD/HD. Feels he copes w/ depression. Some over eating, low motivation. Currently in school at [employer]. Came out to his parents 2 weeks ago, went well. Depression was increased about 2 months ago, school stress, a friend passed away from lack of insulin. Tends to show anger, lash out at other. Gets defensive, angry, berates others - especially during video games. Another closer friend died of suicide last Summer. grM passed 2 weeks ago, he wasn't that close to her however. Reports his mother may cope w/ depression and alcohol abuse years ago and both sets of grandparents.   Mental Status Exam:   Appearance:   Casual     Behavior:  Appropriate  Motor:  Normal  Speech/Language:   Clear and Coherent  Affect:  Constricted  Mood:  depressed  Thought process:  normal  Thought content:    WNL  Sensory/Perceptual disturbances:    WNL  Orientation:  x4  Attention:  Good  Concentration:  Good  Memory:  WNL  Fund of knowledge:   Good  Insight:    Good  Judgment:   Good  Impulse Control:  fair   Reported Symptoms:  Concentration deficits (increasing last few weeks), impulsivity, some hyperactivity, daily depressed mood (5/10 in severity), fatigue, sleep interruption (excessive sleep)  Risk Assessment: Danger to Self:  No Self-injurious Behavior: No Danger to Others: No Duty to Warn:no Physical Aggression / Violence:No  Access to Firearms a concern: No  Gang Involvement:No  Patient / guardian was educated about steps to take if suicide or homicide risk level increases between visits: While future psychiatric events cannot be accurately predicted, the  patient does not currently require acute inpatient psychiatric care and does not currently meet [facility] involuntary commitment criteria.  Substance Abuse History: Current substance abuse: No     Past Psychiatric History:   Previous psychological history is significant for ADHD Outpatient Providers: [facility] History of Psych Hospitalization: No  Psychological Testing:  none  Medical History/Surgical History:  No past medical history on file.  No past surgical history on file.  Medications: Current Outpatient Medications  Medication Sig Dispense Refill  . CLARAVIS 40 MG capsule Take 40 mg of amoxicillin by mouth daily.    . methylPREDNISolone (MEDROL DOSEPAK) 4 MG TBPK tablet Take as directed 21 tablet 0   No current facility-administered medications for this visit.     No Known Allergies  Diagnoses:    ICD-10-CM   1. Major depressive disorder, recurrent episode, moderate (HCC)  F33.1     Plan of Care:  Complete assmt next session.   Anson [place], [facility]

## 2019-05-12 DIAGNOSIS — K130 Diseases of lips: Secondary | ICD-10-CM | POA: Diagnosis not present

## 2019-05-12 DIAGNOSIS — L709 Acne, unspecified: Secondary | ICD-10-CM | POA: Diagnosis not present

## 2019-05-23 ENCOUNTER — Telehealth: Payer: Self-pay | Admitting: Psychiatry

## 2019-05-23 ENCOUNTER — Ambulatory Visit (INDEPENDENT_AMBULATORY_CARE_PROVIDER_SITE_OTHER): Payer: BC Managed Care – PPO | Admitting: Mental Health

## 2019-05-23 ENCOUNTER — Other Ambulatory Visit: Payer: Self-pay

## 2019-05-23 DIAGNOSIS — F331 Major depressive disorder, recurrent, moderate: Secondary | ICD-10-CM | POA: Diagnosis not present

## 2019-05-23 NOTE — Progress Notes (Signed)
[facility] Counselor / Therapist Psychotherapy Note  Name: Shawn SimsJames M Hams Date: 05/30/2019 MRN: [id number] DOB: [birth year] PCP: Shirline FreesNafziger, Cory, NP  Time spent: 49 minutes  Guardian/Payee:  self  Paperwork requested:  none  Mental Status Exam:   Appearance:   Casual     Behavior:  Appropriate  Motor:  Normal  Speech/Language:   Clear and Coherent  Affect:  Constricted  Mood:  depressed  Thought process:  normal  Thought content:    WNL  Sensory/Perceptual disturbances:    WNL  Orientation:  x4  Attention:  Good  Concentration:  Good  Memory:  WNL  Fund of knowledge:   Good  Insight:    Good  Judgment:   Good  Impulse Control:  fair   Reported Symptoms:  Concentration deficits (increasing last few weeks), impulsivity, some hyperactivity, daily depressed mood (5/10 in severity), fatigue, sleep interruption (excessive sleep)  Risk Assessment: Danger to Self:  No Self-injurious Behavior: No Danger to Others: No Duty to Warn:no Physical Aggression / Violence:No  Access to Firearms a concern: No  Gang Involvement:No  Patient / guardian was educated about steps to take if suicide or homicide risk level increases between visits: While future psychiatric events cannot be accurately predicted, the patient does not currently require acute inpatient psychiatric care and [facility] not [facility] criteria.  Substance Abuse History: Current substance abuse: No     Past Psychiatric History:   Previous psychological history is significant for ADHD Outpatient Providers: [facility] History of Psych Hospitalization: No  Psychological Testing:  none  Medical History/Surgical History:  No past medical history on file.  No past surgical history on file.  Medications: Current Outpatient Medications  Medication Sig Dispense Refill  . CLARAVIS 40 MG capsule Take 40 mg of amoxicillin by mouth daily.    . methylPREDNISolone (MEDROL DOSEPAK) 4 MG  TBPK tablet Take as directed 21 tablet 0   No current facility-administered medications for this visit.     No Known Allergies   Abuse History: Victim- none Report needed: No. Victim of Neglect:No. Perpetrator of none  Witness / Exposure to Domestic Violence: No   Protective Services Involvement: No  Witness to [employer] Violence:  No   Family History:  Raised by both parents.  Brother -age 22  sister - age 22  Social History:  Living situation: lives w/ parents  Sexual Orientation:  gay  Relationship Status:  none Name of spouse / other:  none             If a parent, number of children / ages:  none  Support Systems;  Friends; online friends, family  Financial Stress:  No   Income/Employment/Disability:  none  Financial plannerMilitary Service: No   Educational History: Education: some college  Oncologisteligion/Sprituality/World View:   none  Any cultural differences that may affect / interfere with treatment: none  Recreation/Hobbies:  Gaming, walking, photography  Stressors:   Strengths:  motivated for change, open minded person  Barriers:  none  Legal History: Pending legal issue / charges: none History of legal issue / charges: none  Subjective: Pt presented on time for today's session. Completed part II of assessment.  Patient continued to share history related to family and social experiences.  Endorses continued depression.  Denies SI.  Shared how he is close with his siblings sharing some history and details.  Shared family history related to his close connection with his grandfather who passed away a few years ago.  Shared how his brother was closer to his grandmother than he.  We continue to explore his reports of irritability as he was able to give some examples of how he gets agitated and "snappy" with others.  He plans to return to school in about 2 weeks to [employer].  He plans to look into tele-health for continued therapy.  Patient was advised to contact  this office between sessions if needed.  Interventions: CBT  Diagnoses:    ICD-10-CM   1. Major depressive disorder, recurrent episode, moderate (HCC)  F33.1     Plan of Care:  Complete assmt next session.   Anson [place], [facility]

## 2019-05-24 NOTE — Telephone Encounter (Signed)
Not needed

## 2019-06-08 ENCOUNTER — Ambulatory Visit (INDEPENDENT_AMBULATORY_CARE_PROVIDER_SITE_OTHER): Payer: BC Managed Care – PPO | Admitting: Mental Health

## 2019-06-08 ENCOUNTER — Other Ambulatory Visit: Payer: Self-pay

## 2019-06-08 DIAGNOSIS — F331 Major depressive disorder, recurrent, moderate: Secondary | ICD-10-CM | POA: Diagnosis not present

## 2019-06-08 NOTE — Progress Notes (Signed)
[facility] Counselor / Therapist Psychotherapy Note  Name: Shawn Osborne Date: 06/08/2019 MRN: [id number] DOB: Mar 06, 1996 PCP: Dorothyann Peng, NP  Time spent: 50 minutes  Therapist: individual therapy  Mental Status Exam:   Appearance:   Casual     Behavior:  Appropriate  Motor:  Normal  Speech/Language:   Clear and Coherent  Affect:  Constricted  Mood:  depressed  Thought process:  normal  Thought content:    WNL  Sensory/Perceptual disturbances:    WNL  Orientation:  x4  Attention:  Good  Concentration:  Good  Memory:  WNL  Fund of knowledge:   Good  Insight:    Good  Judgment:   Good  Impulse Control:  fair   Reported Symptoms:  Concentration deficits (increasing last few weeks), impulsivity, some hyperactivity, daily depressed mood (5/10 in severity), fatigue, sleep interruption (excessive sleep)   Risk Assessment: Danger to Self:  No Self-injurious Behavior: No Danger to Others: No Duty to Warn:no  Physical Aggression / Violence:No  Access to Firearms a concern: No  Gang Involvement:No  Patient / guardian was educated about steps to take if suicide or homicide risk level increases between visits: While future psychiatric events cannot be accurately predicted, the patient does not currently require acute inpatient psychiatric care and does not currently meet [facility] involuntary commitment criteria.  Substance Abuse History: Current substance abuse: No     Subjective: pt is now at college, we connect via phone for today's session. Continues to feel tired most days. Encouraged some walking exercise has he cannot lift weights which is his main form of exercise. Copes w/ low motivation; did not hang out w/ friends last night. Reports low self esteem, lonliness, feeling of inadequacy, some body image issues (grew up being bullied for his weight). He agreed to continue to give thought about his feelings of inadequacy. Continue to explore supportive relationships,  family and friendships.  Patient shared how he is closer to his mother than his father and discussing more personal issues.  Shared how he and his brother get along well but are different in personality.  Shared how his brother is closer to his father, they share more interests.  Reviewed benefits of keeping a daily calendar for scheduling and integrating exercise when possible while keeping up with academics.  Patient agreed to follow through.  Patient was advised to contact this office between sessions if needed.  Interventions: CBT  Diagnoses:    ICD-10-CM   1. Major depressive disorder, recurrent episode, moderate (HCC)  F33.1       Individualized Plan of Care:  1. Patient to engage psychiatric evaluation and follow medication regimen.  2. Patient to engage in individual psychotherapy.  3. Patient to identify and apply coping skills learned in session to decrease symptoms, improve self esteem/self worth, and improve self-care.  4. Patient to learn and apply CBT, problem solving, communication skills and strategies learned in session.  5. Patient to contact this office, go to the local ED or call 911 if a crisis or emergency develops between visits.   Anson [place], [facility]

## 2019-06-16 DIAGNOSIS — L709 Acne, unspecified: Secondary | ICD-10-CM | POA: Diagnosis not present

## 2019-06-16 DIAGNOSIS — K130 Diseases of lips: Secondary | ICD-10-CM | POA: Diagnosis not present

## 2019-06-23 ENCOUNTER — Ambulatory Visit: Payer: BC Managed Care – PPO | Admitting: Mental Health

## 2019-07-29 DIAGNOSIS — L709 Acne, unspecified: Secondary | ICD-10-CM | POA: Diagnosis not present

## 2019-07-29 DIAGNOSIS — K130 Diseases of lips: Secondary | ICD-10-CM | POA: Diagnosis not present

## 2019-09-02 DIAGNOSIS — K130 Diseases of lips: Secondary | ICD-10-CM | POA: Diagnosis not present

## 2019-09-02 DIAGNOSIS — L709 Acne, unspecified: Secondary | ICD-10-CM | POA: Diagnosis not present

## 2019-10-03 DIAGNOSIS — L709 Acne, unspecified: Secondary | ICD-10-CM | POA: Diagnosis not present

## 2019-10-03 DIAGNOSIS — K130 Diseases of lips: Secondary | ICD-10-CM | POA: Diagnosis not present

## 2019-11-10 DIAGNOSIS — L709 Acne, unspecified: Secondary | ICD-10-CM | POA: Diagnosis not present

## 2019-11-10 DIAGNOSIS — K130 Diseases of lips: Secondary | ICD-10-CM | POA: Diagnosis not present

## 2019-12-12 DIAGNOSIS — K130 Diseases of lips: Secondary | ICD-10-CM | POA: Diagnosis not present

## 2019-12-12 DIAGNOSIS — L709 Acne, unspecified: Secondary | ICD-10-CM | POA: Diagnosis not present

## 2019-12-12 DIAGNOSIS — L910 Hypertrophic scar: Secondary | ICD-10-CM | POA: Diagnosis not present

## 2020-01-30 ENCOUNTER — Encounter: Payer: Self-pay | Admitting: Dermatology

## 2020-01-30 ENCOUNTER — Ambulatory Visit: Payer: BLUE CROSS/BLUE SHIELD | Admitting: Dermatology

## 2020-01-30 ENCOUNTER — Other Ambulatory Visit: Payer: Self-pay

## 2020-01-30 DIAGNOSIS — L700 Acne vulgaris: Secondary | ICD-10-CM | POA: Diagnosis not present

## 2020-01-30 DIAGNOSIS — L730 Acne keloid: Secondary | ICD-10-CM

## 2020-01-30 DIAGNOSIS — L910 Hypertrophic scar: Secondary | ICD-10-CM | POA: Diagnosis not present

## 2020-01-30 MED ORDER — TRIAMCINOLONE ACETONIDE 40 MG/ML IJ SUSP
40.0000 mg | Freq: Once | INTRAMUSCULAR | Status: AC
  Administered 2020-01-30: 15:00:00 40 mg via INTRAMUSCULAR

## 2020-01-30 MED ORDER — TRIAMCINOLONE ACETONIDE 40 MG/ML IJ SUSP
40.0000 mg | Freq: Once | INTRAMUSCULAR | Status: AC
  Administered 2020-01-30: 40 mg

## 2020-01-31 NOTE — Progress Notes (Signed)
   Follow-Up Visit   Subjective  PATRICIA PERALES is a 24 y.o. male who presents for the following: Follow-up (Recheck keloids. Injected by J[facility] on 12/12/2019 and feels that they responded well.).  Acne with secondary keloids Location: Torso Duration: Several years Quality: Acne control, keloids persist Associated Signs/Symptoms: Itching Modifying Factors: Injected 1 time with slight improvement Severity:  Timing: Context:   The following portions of the chart were reviewed this encounter and updated as appropriate:     Objective  Well appearing patient in no apparent distress; mood and affect are within normal limits.  All skin waist up examined.   Assessment & Plan  Acne keloid (5) Left Shoulder - Posterior; Chest - Medial (Center) (2); Left Upper Back (2)  Intralesional 40mg /cc triamcinolone, total 1.5cc. Discuss all treatment options as well as non-intervention.  Acne vulgaris (2) Left Upper Back; Right Upper Back  Keloid (5) Left Shoulder - Anterior; Right Shoulder - Anterior; Left Shoulder - Posterior; Chest - Medial [facility]); Right Upper Back  Intralesional injection - Chest - Medial [facility]), Left Shoulder - Anterior, Left Shoulder - Posterior, Right Shoulder - Anterior, Right Upper Back IL TA 40mg /cc 1.5cc  Rafik has several dozen mostly 1 cm and smaller keloids.  Wall raised lesions were injected by me with triamcinolone, all were relatively easy to inject and I am cautiously optimistic that he will see reduction in symptoms and flattening with relatively few injections.  Follow-up in 1 to 2 months.

## 2020-02-10 ENCOUNTER — Ambulatory Visit (INDEPENDENT_AMBULATORY_CARE_PROVIDER_SITE_OTHER): Payer: BC Managed Care – PPO | Admitting: Mental Health

## 2020-02-10 ENCOUNTER — Other Ambulatory Visit: Payer: Self-pay

## 2020-02-10 DIAGNOSIS — F909 Attention-deficit hyperactivity disorder, unspecified type: Secondary | ICD-10-CM

## 2020-02-10 DIAGNOSIS — F331 Major depressive disorder, recurrent, moderate: Secondary | ICD-10-CM | POA: Diagnosis not present

## 2020-02-10 NOTE — Progress Notes (Signed)
[facility] Counselor / Therapist Psychotherapy Note  Name: Shawn Osborne Date: 02/10/2020 MRN: [id number] DOB: Jul 10, 1996 PCP: Shirline Frees, NP  Time spent: 54 minutes  Therapist: individual therapy  Mental Status Exam:   Appearance:   Casual     Behavior:  Appropriate  Motor:  Normal  Speech/Language:   Clear and Coherent  Affect:  Constricted  Mood:  depressed  Thought process:  normal  Thought content:    WNL  Sensory/Perceptual disturbances:    WNL  Orientation:  x4  Attention:  Good  Concentration:  Good  Memory:  WNL  Fund of knowledge:   Good  Insight:    Good  Judgment:   Good  Impulse Control:  fair   Reported Symptoms:  Concentration deficits (increasing last few weeks), impulsivity, some hyperactivity, daily depressed mood (5/10 in severity), fatigue, sleep interruption (excessive sleep)   Risk Assessment: Danger to Self:  No Self-injurious Behavior: No Danger to Others: No Duty to Warn:no  Physical Aggression / Violence:No  Access to Firearms a concern: No  Gang Involvement:No  Patient / guardian was educated about steps to take if suicide or homicide risk level increases between visits: While future psychiatric events cannot be accurately predicted, the patient does not currently require acute inpatient psychiatric care and does not currently meet [facility] involuntary commitment criteria.  Substance Abuse History: Current substance abuse: No     Subjective:  Patient presents for session after being absent for about 6 months.  He is completed more college course work and he is on his last semester in which she will graduate.  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  He stated that he feels his depression has been increased recently but only due to the job search but also for unexplained circumstances.  He has a  history of taking depression medication and we encouraged him to engage in psychiatric eval where he was agreeable.  He shared a recent slight falling out with a close friend and expressed sentiments of loneliness considering that this friendship will probably end soon as it has in the past as part of the cycle.  Provide support, encouragement as he processed feelings related to these events and experiences.  Specifically assisted him in reframing some of his thinking related to his somewhat hopeless statements regarding his job outlook.  Interventions: CBT  Diagnoses:    ICD-10-CM   1. Major depressive disorder, recurrent episode, moderate (HCC)  F33.1   2. Attention deficit hyperactivity disorder (ADHD), unspecified ADHD type  F90.9       Individualized Plan of Care:  1. Patient to engage psychiatric evaluation and follow medication regimen.  2. Patient to engage in individual psychotherapy.  3. Patient to identify and apply coping skills learned in session to decrease symptoms, improve self esteem/self worth, and improve self-care.  4. Patient to learn and apply CBT, problem solving, communication skills and strategies learned in session.  5. Patient to contact this office, go to the local ED or call 911 if a crisis or emergency develops between visits.   Waldron Session, [facility]

## 2020-02-27 ENCOUNTER — Ambulatory Visit: Payer: Self-pay | Admitting: Physician Assistant

## 2020-02-29 ENCOUNTER — Ambulatory Visit: Payer: BC Managed Care – PPO | Admitting: Adult Health

## 2020-03-06 ENCOUNTER — Other Ambulatory Visit: Payer: Self-pay

## 2020-03-06 ENCOUNTER — Ambulatory Visit (INDEPENDENT_AMBULATORY_CARE_PROVIDER_SITE_OTHER): Payer: BC Managed Care – PPO | Admitting: Mental Health

## 2020-03-06 DIAGNOSIS — F331 Major depressive disorder, recurrent, moderate: Secondary | ICD-10-CM

## 2020-03-06 NOTE — Progress Notes (Signed)
[facility] Counselor / Therapist Psychotherapy Note  Name: Shawn Osborne Date: 03/06/2020 MRN: [id number] DOB: [birth year] PCP: Shirline Frees, NP  Time spent: 54 minutes  Therapist: individual therapy  Mental Status Exam:   Appearance:   Casual     Behavior:  Appropriate  Motor:  Normal  Speech/Language:   Clear and Coherent  Affect:  Constricted  Mood:  depressed  Thought process:  normal  Thought content:    WNL  Sensory/Perceptual disturbances:    WNL  Orientation:  x4  Attention:  Good  Concentration:  Good  Memory:  WNL  Fund of knowledge:   Good  Insight:    Good  Judgment:   Good  Impulse Control:  fair   Reported Symptoms:  Concentration deficits (increasing last few weeks), impulsivity, some hyperactivity, daily depressed mood (5/10 in severity), fatigue, sleep interruption (excessive sleep)   Risk Assessment: Danger to Self:  No Self-injurious Behavior: No Danger to Others: No Duty to Warn:no  Physical Aggression / Violence:No  Access to Firearms a concern: No  Gang Involvement:No  Patient / guardian was educated about steps to take if suicide or homicide risk level increases between visits: While future psychiatric events cannot be accurately predicted, the patient does not currently require acute inpatient psychiatric care and does not currently meet [facility] involuntary commitment criteria.  Substance Abuse History: Current substance abuse: No     Subjective:  Patient presents in no distress.  He shared how he graduated college last week and was able to walk for the ceremony.  He stated that his parents were able to come but he would like to have had other friends there but due to the pandemic this was not possible.  He reports he continues to feel depressed most days and feels about the same since last session.  We continue to explore needs.  He identified ruminating about past life regrets, sharing some details.  Through guided discovery, he  identified the need to reach out to one of his friends to obtain related closure.  We also encouraged journaling and discussed concepts of forgiveness of self with which she can focus on as this was also identified as a need.   Interventions: CBT  Diagnoses:  No diagnosis found.    Individualized Plan of Care:  1. Patient to engage psychiatric evaluation and follow medication regimen.  2. Patient to engage in individual psychotherapy.  3. Patient to identify and apply coping skills learned in session to decrease symptoms, improve self esteem/self worth, and improve self-care.  4. Patient to learn and apply CBT, problem solving, communication skills and strategies learned in session.  5. Patient to contact this office, go to the local ED or call 911 if a crisis or emergency develops between visits.   Waldron Session, [facility]

## 2020-03-08 ENCOUNTER — Other Ambulatory Visit: Payer: Self-pay

## 2020-03-08 ENCOUNTER — Ambulatory Visit (INDEPENDENT_AMBULATORY_CARE_PROVIDER_SITE_OTHER): Payer: BC Managed Care – PPO | Admitting: Adult Health

## 2020-03-08 ENCOUNTER — Encounter: Payer: Self-pay | Admitting: Adult Health

## 2020-03-08 VITALS — BP 154/72 | HR 94 | Ht 73.0 in | Wt 250.0 lb

## 2020-03-08 DIAGNOSIS — F909 Attention-deficit hyperactivity disorder, unspecified type: Secondary | ICD-10-CM | POA: Diagnosis not present

## 2020-03-08 DIAGNOSIS — F331 Major depressive disorder, recurrent, moderate: Secondary | ICD-10-CM

## 2020-03-08 MED ORDER — BUPROPION HCL ER (XL) 150 MG PO TB24: ORAL_TABLET | ORAL | 2 refills | Status: DC

## 2020-03-08 NOTE — Progress Notes (Signed)
[facility] MD/PA/NP Initial Note  03/08/2020 3:38 PM Shawn Osborne  MRN:  [id number]  Chief Complaint:   HPI:   Describes mood today as "so-so". Tearful at times. Mood symptoms - reports depression, anxiety, and irritability. Feels more depressed overall. Reports feeling depressed for 3 years. Having "good and bad days, but depression still there on the good days". Unable to identify a precipitant. Stating "I just feel like this constantly". Seeing Elio Forget fortheapy. Decreased interest and motivation. Stating "I try to push myself".  Has taken antidepressants in the past, but is unable to remember any of them - took during muddle school. Energy levels "sluggish". Active, does not have a regular exercise routine. Walking "some" days. Interviewing for jobs. Enjoys some usual interests and activities. Spending time with family. Getting out with friends occasionaly.  Appetite adequate. Weight stable - 250 pounds. Sleeps well most nights. Averages 10 hours.Reports daytime napping. Focus and concentration difficulties. Diagnosed with ADHD in childhood. [facility] seen at [facility] for [facility]. Completing tasks. Managing aspects of household. Graduated from [employer] this month.  Denies SI or HI. Denies AH or VH.  Previous medication trials:  Vyvanse, Daytrana  Visit Diagnosis:    ICD-10-CM   1. Major depressive disorder, recurrent episode, moderate (HCC)  F33.1 buPROPion (WELLBUTRIN XL) 150 MG 24 hr tablet  2. Attention deficit hyperactivity disorder (ADHD), unspecified ADHD type  F90.9 buPROPion (WELLBUTRIN XL) 150 MG 24 hr tablet    Past Psychiatric History: Denies psychiatric hospitalization.  Past Medical History:  Past Medical History:  Diagnosis Date  . Acne    No past surgical history on file.  Family Psychiatric History: Mother and sister with depression.   Family History: No family history on file.  Social History:  Social History   Socioeconomic History  . Marital status:  Single    Spouse name: Not on file  . Number of children: Not on file  . Years of education: Not on file  . Highest education level: Not on file  Occupational History  . Not on file  Tobacco Use  . Smoking status: Never Smoker  . Smokeless tobacco: Never Used  Substance and Sexual Activity  . Alcohol use: Yes  . Drug use: Never  . Sexual activity: Not on file  Other Topics Concern  . Not on file  Social History Narrative  . Not on file   Social Determinants of Health   Financial Resource Strain:   . Difficulty of Paying Living Expenses:   Food Insecurity:   . Worried About Programme researcher, broadcasting/film/video in the Last Year:   . Barista in the Last Year:   Transportation Needs:   . Freight forwarder (Medical):   Marland Kitchen Lack of Transportation (Non-Medical):   Physical Activity:   . Days of Exercise per Week:   . Minutes of Exercise per Session:   Stress:   . Feeling of Stress :   Social Connections:   . Frequency of Communication with Friends and Family:   . Frequency of Social Gatherings with Friends and Family:   . Attends Religious Services:   . Active Member of Clubs or Organizations:   . Attends Banker Meetings:   Marland Kitchen Marital Status:     Allergies: No Known Allergies  Metabolic Disorder Labs: No results found for: HGBA1C, MPG No results found for: PROLACTIN No results found for: CHOL, TRIG, HDL, CHOLHDL, VLDL, LDLCALC No results found for: TSH  Therapeutic Level Labs: No results  found for: LITHIUM No results found for: VALPROATE No components found for:  CBMZ  Current Medications: Current Outpatient Medications  Medication Sig Dispense Refill  . buPROPion (WELLBUTRIN XL) 150 MG 24 hr tablet Take one tablet every morning for 7 days, then take two tablets every morning. 60 tablet 2   No current facility-administered medications for this visit.    Medication Side Effects: none  Orders placed this visit:  No orders of the defined types were  placed in this encounter.   Psychiatric Specialty Exam:  Review of Systems  Musculoskeletal: Negative for gait problem.  Neurological: Negative for tremors.  Psychiatric/Behavioral:       Please refer to HPI    There were no vitals taken for this visit.There is no height or weight on file to calculate BMI.  General Appearance: Neat and Well Groomed  Eye Contact:  Good  Speech:  Clear and Coherent and Normal Rate  Volume:  Normal  Mood:  Depressed  Affect:  Appropriate and Congruent  Thought Process:  Coherent and Descriptions of Associations: Intact  Orientation:  Full (Time, Place, and Person)  Thought Content: Logical   Suicidal Thoughts:  No  Homicidal Thoughts:  No  Memory:  WNL  Judgement:  Good  Insight:  Good  Psychomotor Activity:  Normal  Concentration:  Concentration: Good  Recall:  Good  Fund of Knowledge: Good  Language: Good  Assets:  Communication Skills Desire for Improvement Financial Resources/Insurance Housing Intimacy Leisure Time Physical Health Resilience Social Support Talents/Skills Transportation Vocational/Educational  ADL's:  Intact  Cognition: WNL  Prognosis:  Good   Screenings: None  Receiving Psychotherapy: Yes - Lanetta Inch  Treatment Plan/Recommendations:   Plan:  PDMP reviewed  1.Add Wellbutrin XL 150mg  x 7 days, then 300mg  daily.   Read and reviewed note with patient for accuracy.   RTC 4 weeks  Patient advised to contact office with any questions, adverse effects, or acute worsening in signs and symptoms.     Aloha Gell, NP

## 2020-03-30 ENCOUNTER — Other Ambulatory Visit: Payer: Self-pay | Admitting: Adult Health

## 2020-03-30 DIAGNOSIS — F331 Major depressive disorder, recurrent, moderate: Secondary | ICD-10-CM

## 2020-03-30 DIAGNOSIS — F909 Attention-deficit hyperactivity disorder, unspecified type: Secondary | ICD-10-CM

## 2020-04-02 ENCOUNTER — Ambulatory Visit: Payer: BC Managed Care – PPO | Admitting: Mental Health

## 2020-04-05 ENCOUNTER — Telehealth (INDEPENDENT_AMBULATORY_CARE_PROVIDER_SITE_OTHER): Payer: BC Managed Care – PPO | Admitting: Adult Health

## 2020-04-05 DIAGNOSIS — F331 Major depressive disorder, recurrent, moderate: Secondary | ICD-10-CM

## 2020-04-05 DIAGNOSIS — F909 Attention-deficit hyperactivity disorder, unspecified type: Secondary | ICD-10-CM | POA: Diagnosis not present

## 2020-04-05 MED ORDER — BUPROPION HCL ER (XL) 150 MG PO TB24: ORAL_TABLET | ORAL | 2 refills | Status: DC

## 2020-04-05 NOTE — Progress Notes (Signed)
Shawn Osborne [id number] 08/15/1996 24 y.o.  Virtual Visit via Telephone Note  I connected with pt on 04/05/20 at  3:20 PM EDT by telephone and verified that I am speaking with the correct person using two identifiers.   I discussed the limitations, risks, security and privacy concerns of performing an evaluation and management service by telephone and the availability of in person appointments. I also discussed with the patient that there may be a patient responsible charge related to this service. The patient expressed understanding and agreed to proceed.   I discussed the assessment and treatment plan with the patient. The patient was provided an opportunity to ask questions and all were answered. The patient agreed with the plan and demonstrated an understanding of the instructions.   The patient was advised to call back or seek an in-person evaluation if the symptoms worsen or if the condition fails to improve as anticipated.  I provided 30 minutes of non-face-to-face time during this encounter.  The patient was located at home.  The provider was located at [place].   Aloha Gell, NP   Subjective:   Patient ID:  Shawn Osborne is a 24 y.o. (DOB [birth year]) male.  Chief Complaint: No chief complaint on file.   HPI OREOLUWA AIGNER presents for follow-up of MDD and GAD.  Describes mood today as "ok". Decreased tearfulness. Mood symptoms - reports decreased depression, anxiety, and irritability. More anxious with all the changes. Worries about "normal" stuff. Having better days. Feels like the Wellbutrin is working "really well" for him. Seeing Lanetta Inch for theapy. Improved interest and motivation.  Energy levels better. Active, does not have a regular exercise routine. Found a job in [place]. Enjoys some usual interests and activities. Single. Lives alone. Recently moved to [place] - living in an apartment. Spending time with family. Appetite decreased  - not snacking as much. Weight loss - 248 pounds. Sleeps well most nights. Averages 8 hours. Decreased daytime napping. Focus and concentration "much better". Diagnosed with ADHD in childhood. Was seen at [facility] for several years. Completing tasks. Managing aspects of household.  Denies SI or HI. Denies AH or VH.  Previous medication trials:  Vyvanse, Daytrana   Review of Systems:  Review of Systems  Musculoskeletal: Negative for gait problem.  Neurological: Negative for tremors.  Psychiatric/Behavioral:       Please refer to HPI    Medications: I have reviewed the patient's current medications.  Current Outpatient Medications  Medication Sig Dispense Refill  . buPROPion (WELLBUTRIN XL) 150 MG 24 hr tablet Take one tablet every morning for 7 days, then take two tablets every morning. 60 tablet 2   No current facility-administered medications for this visit.    Medication Side Effects: None  Allergies: No Known Allergies  Past Medical History:  Diagnosis Date  . Acne     No family history on file.  Social History   Socioeconomic History  . Marital status: Single    Spouse name: Not on file  . Number of children: Not on file  . Years of education: Not on file  . Highest education level: Not on file  Occupational History  . Not on file  Tobacco Use  . Smoking status: Never Smoker  . Smokeless tobacco: Never Used  Vaping Use  . Vaping Use: Never used  Substance and Sexual Activity  . Alcohol use: Yes  . Drug use: Never  . Sexual activity: Not on file  Other Topics Concern  .  Not on file  Social History Narrative  . Not on file   Social Determinants of Health   Financial Resource Strain:   . Difficulty of Paying Living Expenses:   Food Insecurity:   . Worried About Programme researcher, broadcasting/film/video in the Last Year:   . Barista in the Last Year:   Transportation Needs:   . Freight forwarder (Medical):   Marland Kitchen Lack of Transportation (Non-Medical):   Physical  Activity:   . Days of Exercise per Week:   . Minutes of Exercise per Session:   Stress:   . Feeling of Stress :   Social Connections:   . Frequency of Communication with Friends and Family:   . Frequency of Social Gatherings with Friends and Family:   . Attends Religious Services:   . Active Member of Clubs or Organizations:   . Attends Banker Meetings:   Marland Kitchen Marital Status:   Intimate Partner Violence:   . Fear of Current or Ex-Partner:   . Emotionally Abused:   Marland Kitchen Physically Abused:   . Sexually Abused:     Past Medical History, Surgical history, Social history, and Family history were reviewed and updated as appropriate.   Please see review of systems for further details on the patient's review from today.   Objective:   Physical Exam:  There were no vitals taken for this visit.  Physical Exam Constitutional:      General: He is not in acute distress. Musculoskeletal:        General: No deformity.  Neurological:     Mental Status: He is alert and oriented to person, place, and time.     Coordination: Coordination normal.  Psychiatric:        Attention and Perception: Attention and perception normal. He does not perceive auditory or visual hallucinations.        Mood and Affect: Mood normal. Mood is not anxious or depressed. Affect is not labile, blunt, angry or inappropriate.        Speech: Speech normal.        Behavior: Behavior normal.        Thought Content: Thought content normal. Thought content is not paranoid or delusional. Thought content does not include homicidal or suicidal ideation. Thought content does not include homicidal or suicidal plan.        Cognition and Memory: Cognition and memory normal.        Judgment: Judgment normal.     Comments: Insight intact     Lab Review:  No results found for: NA, K, CL, CO2, GLUCOSE, BUN, CREATININE, CALCIUM, PROT, ALBUMIN, AST, ALT, ALKPHOS, BILITOT, GFRNONAA, GFRAA  No results found for: WBC, RBC,  HGB, HCT, PLT, MCV, MCH, MCHC, RDW, LYMPHSABS, MONOABS, EOSABS, BASOSABS  No results found for: POCLITH, LITHIUM   No results found for: PHENYTOIN, PHENOBARB, VALPROATE, CBMZ   .res Assessment: Plan:    Plan:  PDMP reviewed  1. Continue Wellbutrin XL 300mg  daily.   RTC 4 weeks  Patient advised to contact office with any questions, adverse effects, or acute worsening in signs and symptoms.    There are no diagnoses linked to this encounter.  Please see After Visit Summary for patient specific instructions.  Future Appointments  Date Time Provider Department Center  04/30/2020  2:00 PM 07/01/2020, [facility] CP-CP None    No orders of the defined types were placed in this encounter.     -------------------------------

## 2020-04-30 ENCOUNTER — Other Ambulatory Visit: Payer: Self-pay | Admitting: Adult Health

## 2020-04-30 ENCOUNTER — Ambulatory Visit: Payer: BC Managed Care – PPO | Admitting: Mental Health

## 2020-04-30 DIAGNOSIS — F909 Attention-deficit hyperactivity disorder, unspecified type: Secondary | ICD-10-CM

## 2020-04-30 DIAGNOSIS — F331 Major depressive disorder, recurrent, moderate: Secondary | ICD-10-CM

## 2020-06-14 ENCOUNTER — Other Ambulatory Visit: Payer: Self-pay | Admitting: Adult Health

## 2020-06-14 DIAGNOSIS — F909 Attention-deficit hyperactivity disorder, unspecified type: Secondary | ICD-10-CM

## 2020-06-14 DIAGNOSIS — F331 Major depressive disorder, recurrent, moderate: Secondary | ICD-10-CM

## 2020-09-07 ENCOUNTER — Other Ambulatory Visit: Payer: Self-pay | Admitting: Adult Health

## 2020-09-07 DIAGNOSIS — F331 Major depressive disorder, recurrent, moderate: Secondary | ICD-10-CM

## 2020-09-07 DIAGNOSIS — F909 Attention-deficit hyperactivity disorder, unspecified type: Secondary | ICD-10-CM
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
